# Patient Record
Sex: Male | Born: 1965 | Hispanic: No | Marital: Single | Smoking: Current every day smoker
Health system: Southern US, Community
[De-identification: ages and names within clinical notes are randomized; demographics above are authoritative.]

## PROBLEM LIST (undated history)

## (undated) HISTORY — PX: EYE SURGERY: SHX253

---

## 2001-01-12 ENCOUNTER — Emergency Department (HOSPITAL_COMMUNITY): Admission: EM | Admit: 2001-01-12 | Discharge: 2001-01-12 | Payer: Self-pay

## 2001-03-05 ENCOUNTER — Emergency Department (HOSPITAL_COMMUNITY): Admission: EM | Admit: 2001-03-05 | Discharge: 2001-03-05 | Payer: Self-pay | Admitting: Emergency Medicine

## 2013-09-23 ENCOUNTER — Ambulatory Visit (INDEPENDENT_AMBULATORY_CARE_PROVIDER_SITE_OTHER): Payer: BC Managed Care – PPO | Admitting: Physician Assistant

## 2013-09-23 ENCOUNTER — Ambulatory Visit: Payer: BC Managed Care – PPO

## 2013-09-23 VITALS — BP 106/82 | HR 79 | Temp 98.0°F | Resp 18 | Ht 63.0 in | Wt 147.0 lb

## 2013-09-23 DIAGNOSIS — Z789 Other specified health status: Secondary | ICD-10-CM

## 2013-09-23 DIAGNOSIS — D649 Anemia, unspecified: Secondary | ICD-10-CM

## 2013-09-23 DIAGNOSIS — R05 Cough: Secondary | ICD-10-CM

## 2013-09-23 DIAGNOSIS — J029 Acute pharyngitis, unspecified: Secondary | ICD-10-CM

## 2013-09-23 DIAGNOSIS — Z609 Problem related to social environment, unspecified: Secondary | ICD-10-CM

## 2013-09-23 DIAGNOSIS — R059 Cough, unspecified: Secondary | ICD-10-CM

## 2013-09-23 DIAGNOSIS — J189 Pneumonia, unspecified organism: Secondary | ICD-10-CM

## 2013-09-23 LAB — POCT CBC
Granulocyte percent: 54.5 %G (ref 37–80)
HEMATOCRIT: 37.8 % — AB (ref 43.5–53.7)
Hemoglobin: 11.9 g/dL — AB (ref 14.1–18.1)
LYMPH, POC: 1.9 (ref 0.6–3.4)
MCH: 29.9 pg (ref 27–31.2)
MCHC: 31.5 g/dL — AB (ref 31.8–35.4)
MCV: 95.1 fL (ref 80–97)
MID (cbc): 0.6 (ref 0–0.9)
MPV: 8.4 fL (ref 0–99.8)
POC Granulocyte: 3 (ref 2–6.9)
POC LYMPH %: 34.9 % (ref 10–50)
POC MID %: 10.6 %M (ref 0–12)
Platelet Count, POC: 201 10*3/uL (ref 142–424)
RBC: 3.98 M/uL — AB (ref 4.69–6.13)
RDW, POC: 13.5 %
WBC: 5.5 10*3/uL (ref 4.6–10.2)

## 2013-09-23 LAB — POCT RAPID STREP A (OFFICE): RAPID STREP A SCREEN: NEGATIVE

## 2013-09-23 MED ORDER — AZITHROMYCIN 500 MG PO TABS: 500.0000 mg | ORAL_TABLET | Freq: Every day | ORAL | Status: DC

## 2013-09-23 MED ORDER — HYDROCODONE-HOMATROPINE 5-1.5 MG/5ML PO SYRP
ORAL_SOLUTION | ORAL | Status: AC
Start: 2013-09-23 — End: ?

## 2013-09-23 MED ORDER — IPRATROPIUM BROMIDE 0.06 % NA SOLN: 2.0000 | Freq: Three times a day (TID) | NASAL | Status: DC

## 2013-09-23 NOTE — Progress Notes (Signed)
Subjective:    Patient ID: William Nichols, male    DOB: 05/26/1966, 48 y.o.   MRN: [id number]  HPI Primary Physician: No primary provider on file.  Chief Complaint: URI x 1 month  HPI: 48 y.o. [place] male with history below presents with 1 month history of on and off nasal congestion, rhinorrhea, post nasal drip, sore throat, cough, and chills. Afebrile, but with some chills. Cough is productive of white sputum and worse at nighttime keeping him awake. There is some SOB associated with coughing episodes, but no SOB at baseline. No wheezing. No headaches. Normal appetite. No myalgias. No known sick contacts. No flu vaccine this season. Has tried Advil and OTC cough syrup. Last took an antipyretic this afternoon. He is originally from [place] and moved here from there around 14 years ago. He is current everyday tobacco smoker.     History reviewed. No pertinent past medical history.   Home Meds: Prior to Admission medications   Not on File    Allergies: No Known Allergies  History   Social History  . Marital Status: Single    Spouse Name: N/A    Number of Children: N/A  . Years of Education: N/A   Occupational History  . Not on file.   Social History Main Topics  . Smoking status: Current Every Day Smoker  . Smokeless tobacco: Not on file  . Alcohol Use: Not on file  . Drug Use: Not on file  . Sexual Activity: Not on file   Other Topics Concern  . Not on file   Social History Narrative  . No narrative on file     Review of Systems  Constitutional: Positive for chills. Negative for fever, appetite change, fatigue and unexpected weight change.       No night sweats.   HENT: Positive for congestion, postnasal drip, rhinorrhea, sinus pressure and sore throat. Negative for ear pain and hearing loss.        Nasal congestion.   Eyes: Negative for pain, discharge and itching.  Respiratory: Positive for cough and shortness of breath. Negative for wheezing.        Cough is  productive of white sputum. Cough is worse at nighttime.  SOB secondary to coughing episodes only.     Cardiovascular: Negative for chest pain and palpitations.  Gastrointestinal: Negative for nausea, vomiting and diarrhea.  Musculoskeletal: Negative for myalgias.  Skin:       No pruritis.   Neurological: Negative for headaches.       Objective:   Physical Exam  Physical Exam: Blood pressure 106/82, pulse 79, temperature 98 F (36.7 C), resp. rate 18, height 5\' 3"  (1.6 m), weight 147 lb (66.679 kg), SpO2 98.00%., Body mass index is 26.05 kg/(m^2). General: Well developed, well nourished, in no acute distress. Head: Normocephalic, atraumatic, eyes without discharge, sclera non-icteric, nares are congested. Bilateral auditory canals clear, TM's are without perforation, pearly grey and translucent with reflective cone of light bilaterally. Oral cavity moist, posterior pharynx with post nasal drip and mild erythema. No exudate or peritonsillar abscess. Uvula midline.  Neck: Supple. No thyromegaly. Full ROM. No lymphadenopathy. No nuchal rigidity.  Lungs: Coarse breath sounds bilaterally without wheezes, rales, or rhonchi. Breathing is unlabored. Heart: RRR with S1 S2. No murmurs, rubs, or gallops appreciated. Msk:  Strength and tone normal for age. Extremities/Skin: Warm and dry. No clubbing or cyanosis. No edema. No rashes or suspicious lesions. Neuro: Alert and oriented X 3. Moves all extremities spontaneously.  Gait is normal. CNII-XII grossly in tact. Psych:  Responds to questions appropriately with a normal affect.   Labs: Results for orders placed in visit on 09/23/13  POCT CBC      Result Value Ref Range   WBC 5.5  4.6 - 10.2 K/uL   Lymph, poc 1.9  0.6 - 3.4   POC LYMPH PERCENT 34.9  10 - 50 %L   MID (cbc) 0.6  0 - 0.9   POC MID % 10.6  0 - 12 %M   POC Granulocyte 3.0  2 - 6.9   Granulocyte percent 54.5  37 - 80 %G   RBC 3.98 (*) 4.69 - 6.13 M/uL   Hemoglobin 11.9 (*) 14.1 -  18.1 g/dL   HCT, POC 16.1 (*) 09.6 - 53.7 %   MCV 95.1  80 - 97 fL   MCH, POC 29.9  27 - 31.2 pg   MCHC 31.5 (*) 31.8 - 35.4 g/dL   RDW, POC 04.5     Platelet Count, POC 201  142 - 424 K/uL   MPV 8.4  0 - 99.8 fL  POCT RAPID STREP A (OFFICE)      Result Value Ref Range   Rapid Strep A Screen Negative  Negative    Throat culture pending   CXR:  [facility] reading (PRIMARY) by  Dr. Perrin Maltese. Right perihilar infiltrate.       Assessment & Plan:  48 year old male with PNA, anemia, cough, and language barrier.   1) Right perihilar pneumonia  -Azithromycin 500 mg 1 po daily #5 no RF -Hycodan #4oz 1 tsp po q 4-6 hours prn cough no RF SED -Atrovent NS 0.06% 2 sprays each nare bid prn #1 no RF -Rest/fluids -Recheck 48 hours  2) Anemia -Advised patient to follow up for CPE in mid April -Discussed this could be ominous in nature as he has only been to the "doctor 2 times in his life"  3) Language barrier -Son is here to interpret in the room -Patient understands some english     Eula Listen, MHS, PA-C [facility] [address] [place], [place] [address] [phone] [facility] 09/23/2013 8:19 PM

## 2013-09-26 LAB — CULTURE, GROUP A STREP: Organism ID, Bacteria: NORMAL

## 2014-10-28 ENCOUNTER — Ambulatory Visit (INDEPENDENT_AMBULATORY_CARE_PROVIDER_SITE_OTHER): Payer: 59 | Admitting: Emergency Medicine

## 2014-10-28 VITALS — BP 110/68 | HR 80 | Temp 97.9°F | Resp 18 | Ht 64.0 in | Wt 144.0 lb

## 2014-10-28 DIAGNOSIS — L500 Allergic urticaria: Secondary | ICD-10-CM

## 2014-10-28 MED ORDER — FEXOFENADINE HCL 180 MG PO TABS: 180.0000 mg | ORAL_TABLET | Freq: Every day | ORAL | Status: DC

## 2014-10-28 MED ORDER — RANITIDINE HCL 150 MG PO TABS: 150.0000 mg | ORAL_TABLET | Freq: Two times a day (BID) | ORAL | Status: DC

## 2014-10-28 MED ORDER — HYDROXYZINE HCL 50 MG PO TABS: 50.0000 mg | ORAL_TABLET | Freq: Every day | ORAL | Status: DC

## 2014-10-28 NOTE — Progress Notes (Signed)
[facility] [address], [place] [id number] [phone]  Date:  10/28/2014   Name:  William Nichols   DOB:  06-10-1966   MRN:  [id number]  PCP:  No primary care provider on file.    Chief Complaint: Rash   History of Present Illness:  William Nichols is a 49 y.o. very pleasant male patient who presents with the following:  Urticaria over past three weeks No new personal care products or medication No cough or coryza.   No nausea or vomiting. No antecedent illness. No work related exposure that is new. Says itching is worse at night.   No insect bites.   Has a red rash on arms and lower back No improvement with over the counter medications or other home remedies.  Denies other complaint or health concern today.   There are no active problems to display for this patient.   History reviewed. No pertinent past medical history.  Past Surgical History  Procedure Laterality Date  . Eye surgery      History  Substance Use Topics  . Smoking status: Current Every Day Smoker  . Smokeless tobacco: Not on file  . Alcohol Use: Not on file    History reviewed. No pertinent family history.  No Known Allergies  Medication list has been reviewed and updated.  Current Outpatient Prescriptions on File Prior to Visit  Medication Sig Dispense Refill  . azithromycin (ZITHROMAX) 500 MG tablet Take 1 tablet (500 mg total) by mouth daily. (Patient not taking: Reported on 10/28/2014) 5 tablet 0  . HYDROcodone-homatropine (HYCODAN) 5-1.5 MG/5ML syrup 1 TSP PO Q 4-6 HOURS PRN COUGH (Patient not taking: Reported on 10/28/2014) 120 mL 0  . ipratropium (ATROVENT) 0.06 % nasal spray Place 2 sprays into the nose 3 (three) times daily. (Patient not taking: Reported on 10/28/2014) 15 mL 0   No current facility-administered medications on file prior to visit.    Review of Systems:  As per HPI, otherwise negative.    Physical Examination: Filed Vitals:   10/28/14 1512   BP: 110/68  Pulse: 80  Temp: 97.9 F (36.6 C)  Resp: 18   Filed Vitals:   10/28/14 1512  Height: 5\' 4"  (1.626 m)  Weight: 144 lb (65.318 kg)   Body mass index is 24.71 kg/(m^2). Ideal Body Weight: Weight in (lb) to have BMI = 25: 145.3  GEN: WDWN, NAD, Non-toxic, A & O x 3 HEENT: Atraumatic, Normocephalic. Neck supple. No masses, No LAD. Ears and Nose: No external deformity. CV: RRR, No M/G/R. No JVD. No thrill. No extra heart sounds. PULM: CTA B, no wheezes, crackles, rhonchi. No retractions. No resp. distress. No accessory muscle use. ABD: S, NT, ND, +BS. No rebound. No HSM. EXTR: No c/c/e NEURO Normal gait.  PSYCH: Normally interactive. Conversant. Not depressed or anxious appearing.  Calm demeanor.  SKIN:  Erythematous excoriated area both forearms.  Assessment and Plan: Hives Allegra Vistaril Ranitidine  Signed,  Phillips OdorJeffery Shaquella Stamant, MD

## 2014-10-28 NOTE — Patient Instructions (Signed)
Hives Hives are itchy, red, swollen areas of the skin. They can vary in size and location on your body. Hives can come and go for hours or several days (acute hives) or for several weeks (chronic hives). Hives do not spread from person to person (noncontagious). They may get worse with scratching, exercise, and emotional stress. CAUSES   Allergic reaction to food, additives, or drugs.  Infections, including the common cold.  Illness, such as vasculitis, lupus, or thyroid disease.  Exposure to sunlight, heat, or cold.  Exercise.  Stress.  Contact with chemicals. SYMPTOMS   Red or white swollen patches on the skin. The patches may change size, shape, and location quickly and repeatedly.  Itching.  Swelling of the hands, feet, and face. This may occur if hives develop deeper in the skin. DIAGNOSIS  Your caregiver can usually tell what is wrong by performing a physical exam. Skin or blood tests may also be done to determine the cause of your hives. In some cases, the cause cannot be determined. TREATMENT  Mild cases usually get better with medicines such as antihistamines. Severe cases may require an emergency epinephrine injection. If the cause of your hives is known, treatment includes avoiding that trigger.  HOME CARE INSTRUCTIONS   Avoid causes that trigger your hives.  Take antihistamines as directed by your caregiver to reduce the severity of your hives. Non-sedating or low-sedating antihistamines are usually recommended. Do not drive while taking an antihistamine.  Take any other medicines prescribed for itching as directed by your caregiver.  Wear loose-fitting clothing.  Keep all follow-up appointments as directed by your caregiver. SEEK MEDICAL CARE IF:   You have persistent or severe itching that is not relieved with medicine.  You have painful or swollen joints. SEEK IMMEDIATE MEDICAL CARE IF:   You have a fever.  Your tongue or lips are swollen.  You have  trouble breathing or swallowing.  You feel tightness in the throat or chest.  You have abdominal pain. These problems may be the first sign of a life-threatening allergic reaction. Call your local emergency services (911 in [place] MAKE SURE YOU:   Understand these instructions.  Will watch your condition.  Will get help right away if you are not doing well or get worse. Document Released: 06/20/2005 Document Revised: 06/25/2013 Document Reviewed: 09/13/2011 ExitCare Patient Information 2015 [employer]. This information is not intended to replace advice given to you by your health care provider. Make sure you discuss any questions you have with your health care provider.  

## 2014-12-25 ENCOUNTER — Ambulatory Visit (INDEPENDENT_AMBULATORY_CARE_PROVIDER_SITE_OTHER): Payer: 59 | Admitting: Physician Assistant

## 2014-12-25 VITALS — BP 120/82 | HR 71 | Temp 97.6°F | Resp 18 | Ht 63.0 in | Wt 143.0 lb

## 2014-12-25 DIAGNOSIS — L500 Allergic urticaria: Secondary | ICD-10-CM

## 2014-12-25 MED ORDER — FEXOFENADINE HCL 180 MG PO TABS: 180.0000 mg | ORAL_TABLET | Freq: Every day | ORAL | Status: AC

## 2014-12-25 MED ORDER — HYDROXYZINE HCL 50 MG PO TABS: 50.0000 mg | ORAL_TABLET | Freq: Every day | ORAL | Status: AC

## 2014-12-25 MED ORDER — RANITIDINE HCL 150 MG PO TABS: 150.0000 mg | ORAL_TABLET | Freq: Two times a day (BID) | ORAL | Status: AC

## 2014-12-25 NOTE — Progress Notes (Signed)
.   [facility] [address] [place] [address] [phone]  Date:  12/25/2014   Name:  William Nichols   DOB:  1965/08/07   MRN:  [id number]  PCP:  No primary care provider on file.    Chief Complaint: Rash   History of Present Illness:  This is a 49 y.o. male who is presenting with rash "all over" x 2 weeks. He was seen for urticarial rash on 10/28/14 and prescribed allegra, atarax and zantac. Rash resolved on these medications. He ran out of his medications 2 weeks ago and rash came back.  Changed detergent to tide gentle. 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 No change in mattresses or pillows. Moved to new house end of march.  Has had a dog x 1 year, no problems prior to April 2016. No seasonal allergies.  No food allergies. Denies fever, chills, GI sx, SOB, wheezing, CP.  Review of Systems:  Review of Systems See HPI There are no active problems to display for this patient.  Home meds: none   No Known Allergies  Past Surgical History  Procedure Laterality Date  . Eye surgery      History  Substance Use Topics  . Smoking status: Current Every Day Smoker  . Smokeless tobacco: Not on file  . Alcohol Use: Not on file    History reviewed. No pertinent family history.  Medication list has been reviewed and updated.  Physical Examination:  Physical Exam  Constitutional: He is oriented to person, place, and time. He appears well-developed and well-nourished. No distress.  HENT:  Head: Normocephalic and atraumatic.  Right Ear: Hearing normal.  Left Ear: Hearing normal.  Nose: Nose normal.  Eyes: Conjunctivae and lids are normal. Right eye exhibits no discharge. Left eye exhibits no discharge. No scleral icterus.  Cardiovascular: Normal rate, regular rhythm, normal heart sounds and normal pulses.   No murmur heard. Pulmonary/Chest: Effort normal and breath sounds normal. No respiratory distress. He has no wheezes. He has no rhonchi.  He has no rales.  Musculoskeletal: Normal range of motion.  Neurological: He is alert and oriented to person, place, and time.  Skin: Skin is warm, dry and intact. Rash noted. Rash is urticarial (bilateral arms, chest, abdomen, back. sparing the face).  Dermatographism present  Psychiatric: He has a normal mood and affect. His speech is normal and behavior is normal. Thought content normal.   BP 120/82 mmHg  Pulse 71  Temp(Src) 97.6 F (36.4 C) (Oral)  Resp 18  Ht 5\' 3"  (1.6 m)  Wt 143 lb (64.864 kg)  BMI 25.34 kg/m2  SpO2 97%  Assessment and Plan:  1. Allergic urticaria Refilled meds prescribed 4/26 since worked. Zantac BID, atarax QHS, allergra QD. Suspect he is reacting to something in his new house. Referred to allergist for patch testing. - fexofenadine (ALLEGRA ALLERGY) 180 MG tablet; Take 1 tablet (180 mg total) by mouth daily.  Dispense: 30 tablet; Refill: 2 - hydrOXYzine (ATARAX/VISTARIL) 50 MG tablet; Take 1 tablet (50 mg total) by mouth at bedtime. May increase to 100 mg as needed only at night  Dispense: 30 tablet; Refill: 1 - ranitidine (ZANTAC) 150 MG tablet; Take 1 tablet (150 mg total) by mouth 2 (two) times daily.  Dispense: 60 tablet; Refill: 0 - Ambulatory referral to Allergy   Roswell Miners. Dyke Brackett, MHS [facility]  12/25/2014

## 2014-12-25 NOTE — Patient Instructions (Signed)
Take zantac twice a day. Take allergra once a day. Take atarax at night. You will get a phone call to make appt with allergist.

## 2019-03-22 ENCOUNTER — Emergency Department (HOSPITAL_COMMUNITY): Payer: BLUE CROSS/BLUE SHIELD

## 2019-03-22 ENCOUNTER — Emergency Department (HOSPITAL_COMMUNITY)
Admission: EM | Admit: 2019-03-22 | Discharge: 2019-03-22 | Disposition: A | Discharge status: Home / Self Care | Payer: BLUE CROSS/BLUE SHIELD | Attending: Emergency Medicine | Admitting: Emergency Medicine

## 2019-03-22 ENCOUNTER — Other Ambulatory Visit: Payer: Self-pay

## 2019-03-22 DIAGNOSIS — S8992XA Unspecified injury of left lower leg, initial encounter: Secondary | ICD-10-CM | POA: Diagnosis present

## 2019-03-22 DIAGNOSIS — Y929 Unspecified place or not applicable: Secondary | ICD-10-CM | POA: Diagnosis not present

## 2019-03-22 DIAGNOSIS — X58XXXA Exposure to other specified factors, initial encounter: Secondary | ICD-10-CM | POA: Insufficient documentation

## 2019-03-22 DIAGNOSIS — W19XXXA Unspecified fall, initial encounter: Secondary | ICD-10-CM

## 2019-03-22 DIAGNOSIS — F17200 Nicotine dependence, unspecified, uncomplicated: Secondary | ICD-10-CM | POA: Insufficient documentation

## 2019-03-22 DIAGNOSIS — Z79899 Other long term (current) drug therapy: Secondary | ICD-10-CM | POA: Insufficient documentation

## 2019-03-22 DIAGNOSIS — Y999 Unspecified external cause status: Secondary | ICD-10-CM | POA: Insufficient documentation

## 2019-03-22 DIAGNOSIS — S8990XA Unspecified injury of unspecified lower leg, initial encounter: Secondary | ICD-10-CM

## 2019-03-22 DIAGNOSIS — Y939 Activity, unspecified: Secondary | ICD-10-CM | POA: Diagnosis not present

## 2019-03-22 MED ORDER — HYDROCODONE-ACETAMINOPHEN 5-325 MG PO TABS: 1.0000 | ORAL_TABLET | Freq: Four times a day (QID) | ORAL | 0 refills | Status: AC | PRN

## 2019-03-22 MED ORDER — IBUPROFEN 800 MG PO TABS
800.0000 mg | ORAL_TABLET | Freq: Once | ORAL | Status: AC
  Administered 2019-03-22: 800 mg via ORAL
  Filled 2019-03-22: qty 1

## 2019-03-22 MED ORDER — IBUPROFEN 800 MG PO TABS: 800.0000 mg | ORAL_TABLET | Freq: Three times a day (TID) | ORAL | 0 refills | Status: AC | PRN

## 2019-03-22 MED ORDER — IBUPROFEN 200 MG PO TABS
400.0000 mg | ORAL_TABLET | Freq: Once | ORAL | Status: AC | PRN
  Administered 2019-03-22: 400 mg via ORAL
  Filled 2019-03-22: qty 2

## 2019-03-22 NOTE — ED Provider Notes (Signed)
[facility] DEPT Provider Note   CSN: [id number] Arrival date & time: 03/22/19  4098     History   Chief Complaint No chief complaint on file.   HPI William Nichols is a 53 y.o. male.     Pt reports walking on stairs and had a pain in the back of his leg.   The history is provided by the patient. No language interpreter was used.  Leg Pain Location:  Knee and leg Time since incident:  1 day Injury: yes   Leg location:  L lower leg Knee location:  L knee Pain details:    Quality:  Aching   Radiates to:  Does not radiate   Severity:  Moderate   Onset quality:  Gradual   Timing:  Constant   Progression:  Worsening Chronicity:  New Dislocation: no   Tetanus status:  Up to date Relieved by:  Nothing Worsened by:  Nothing Ineffective treatments:  None tried Associated symptoms: no fever   Risk factors: no concern for non-accidental trauma     No past medical history on file.  There are no active problems to display for this patient.   Past Surgical History:  Procedure Laterality Date  . EYE SURGERY          Home Medications    Prior to Admission medications   Medication Sig Start Date End Date Taking? Authorizing Provider  fexofenadine (ALLEGRA ALLERGY) 180 MG tablet Take 1 tablet (180 mg total) by mouth daily. 12/25/14   Ezekiel Slocumb, PA-C  HYDROcodone-homatropine (HYCODAN) 5-1.5 MG/5ML syrup 1 TSP PO Q 4-6 HOURS PRN COUGH Patient not taking: Reported on 10/28/2014 09/23/13   Rise Mu, PA-C  hydrOXYzine (ATARAX/VISTARIL) 50 MG tablet Take 1 tablet (50 mg total) by mouth at bedtime. May increase to 100 mg as needed only at night 12/25/14   Ezekiel Slocumb, PA-C  ranitidine (ZANTAC) 150 MG tablet Take 1 tablet (150 mg total) by mouth 2 (two) times daily. 12/25/14   Ezekiel Slocumb, PA-C    Family History No family history on file.  Social History Social History   Tobacco Use  . Smoking status: Current Every Day Smoker   Substance Use Topics  . Alcohol use: Not on file  . Drug use: Not on file     Allergies   Patient has no known allergies.   Review of Systems Review of Systems  Constitutional: Negative for fever.  All other systems reviewed and are negative.    Physical Exam Updated Vital Signs BP 125/77 (BP Location: Left Arm)   Pulse 79   Temp 98.3 F (36.8 C) (Oral)   Resp 16   Ht 5\' 4"  (1.626 m)   Wt 63.5 kg   SpO2 99%   BMI 24.03 kg/m   Physical Exam Musculoskeletal:        General: Swelling and tenderness present.     Comments: Tender calf muscle swollen,  nv and ns intact   Skin:    General: Skin is warm.  Neurological:     General: No focal deficit present.  Psychiatric:        Mood and Affect: Mood normal.      ED Treatments / Results  Labs (all labs ordered are listed, but only abnormal results are displayed) Labs Reviewed - No data to display  EKG None  Radiology Dg Knee Complete 4 Views Left  Result Date: 03/22/2019 CLINICAL DATA:  Fall yesterday with left knee pain  and swelling. EXAM: LEFT KNEE - COMPLETE 4+ VIEW COMPARISON:  None. FINDINGS: No evidence of fracture, dislocation, or joint effusion. No evidence of arthropathy or other focal bone abnormality. Soft tissues are unremarkable. IMPRESSION: Negative. Electronically Signed   By: Elberta Fortisaniel  Boyle M.D.   On: 03/22/2019 10:28    Procedures Procedures (including critical care time)  Medications Ordered in ED Medications  ibuprofen (ADVIL) tablet 400 mg (400 mg Oral Given 03/22/19 0934)     Initial Impression / Assessment and Plan / ED Course  I have reviewed the triage vital signs and the nursing notes.  Pertinent labs & imaging results that were available during my care of the patient were reviewed by me and considered in my medical decision making (see chart for details).        MDM   Xray no acute.  I suspect muscle tear. probable soleus muscle I doubt DVt   Schedule to see Dr. Magnus IvanBlackman for  evatluion.  Final Clinical Impressions(s) / ED Diagnoses   Final diagnoses:  Injury of calf    ED Discharge Orders         Ordered    HYDROcodone-acetaminophen (NORCO/VICODIN) 5-325 MG tablet  Every 6 hours PRN     03/22/19 1156        An After Visit Summary was printed and given to the patient.   Osie CheeksSofia, Diasha Castleman K, PA-C 03/22/19 1201    Terrilee FilesButler, Michael C, MD 03/22/19 1924

## 2019-03-22 NOTE — Discharge Instructions (Addendum)
Schedule to see the Orthopaedist for evaluation  

## 2019-03-22 NOTE — ED Notes (Signed)
Called ortho tech 

## 2020-04-04 IMAGING — CR DG KNEE COMPLETE 4+V*L*
4 series · 4 of 4 positions shown · non-contrast
Comparison: None.

CLINICAL DATA: Fall yesterday with left knee pain and swelling.

EXAM:
LEFT KNEE - COMPLETE 4+ VIEW

[t knee ap left]
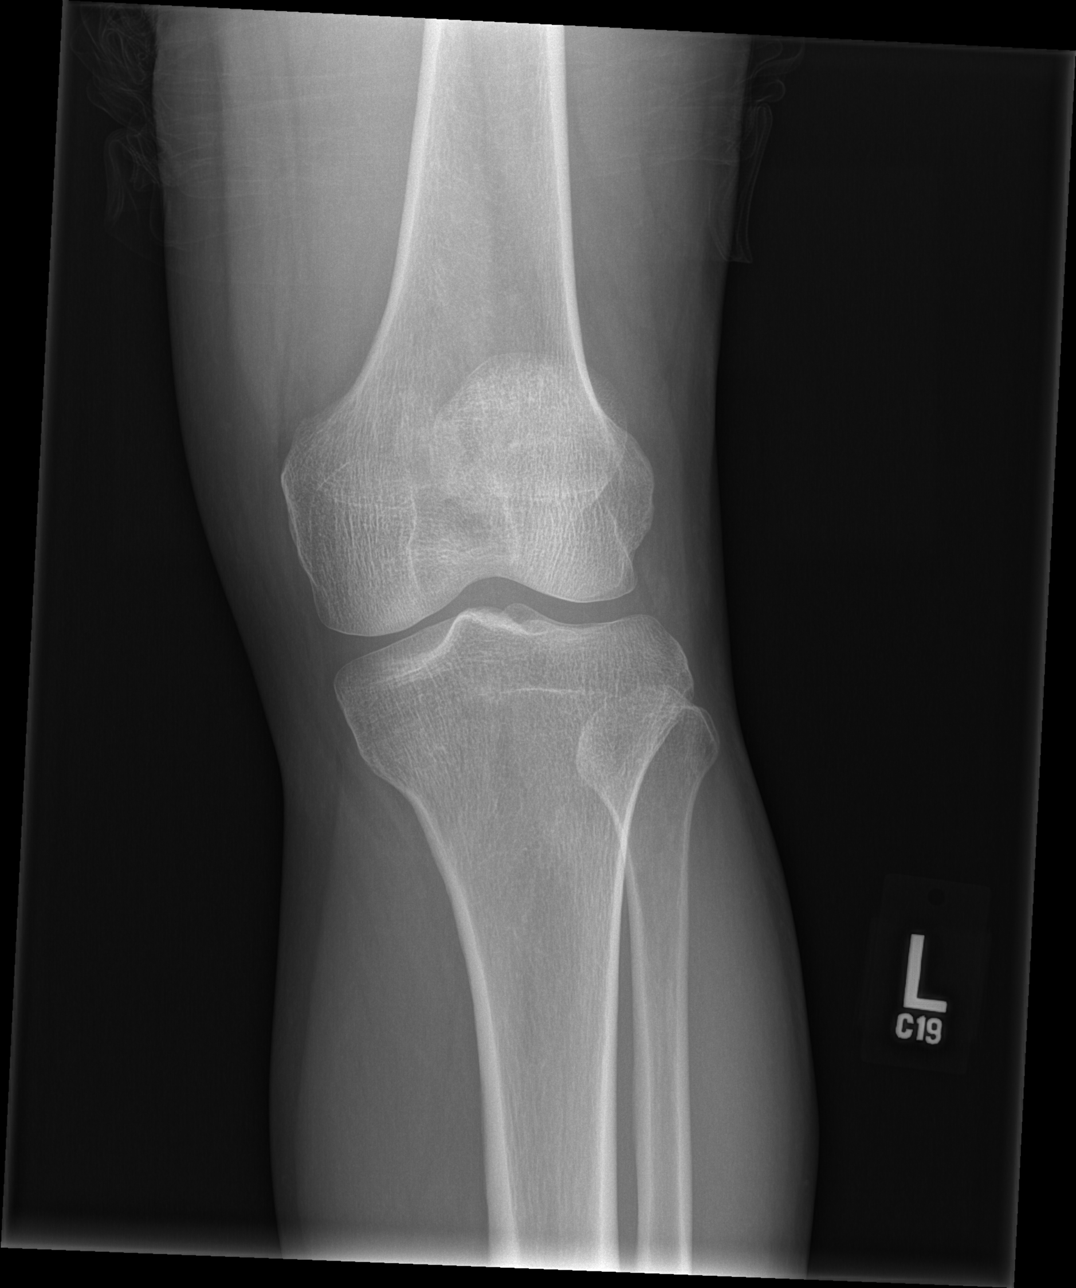

[t knee obl left (1 of 2)]
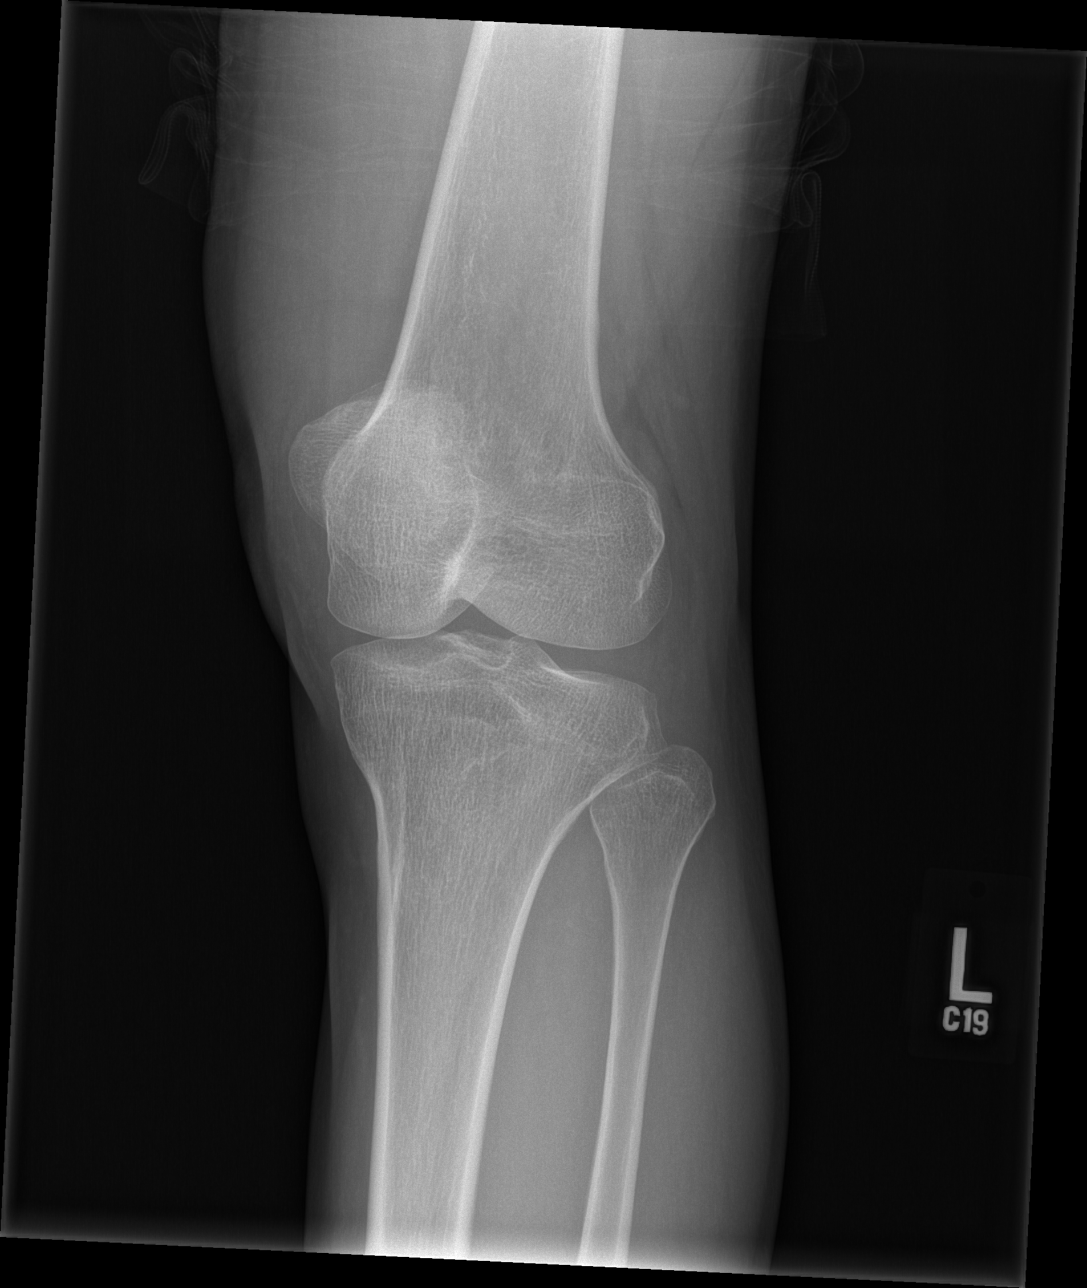

[t knee obl left (2 of 2)]
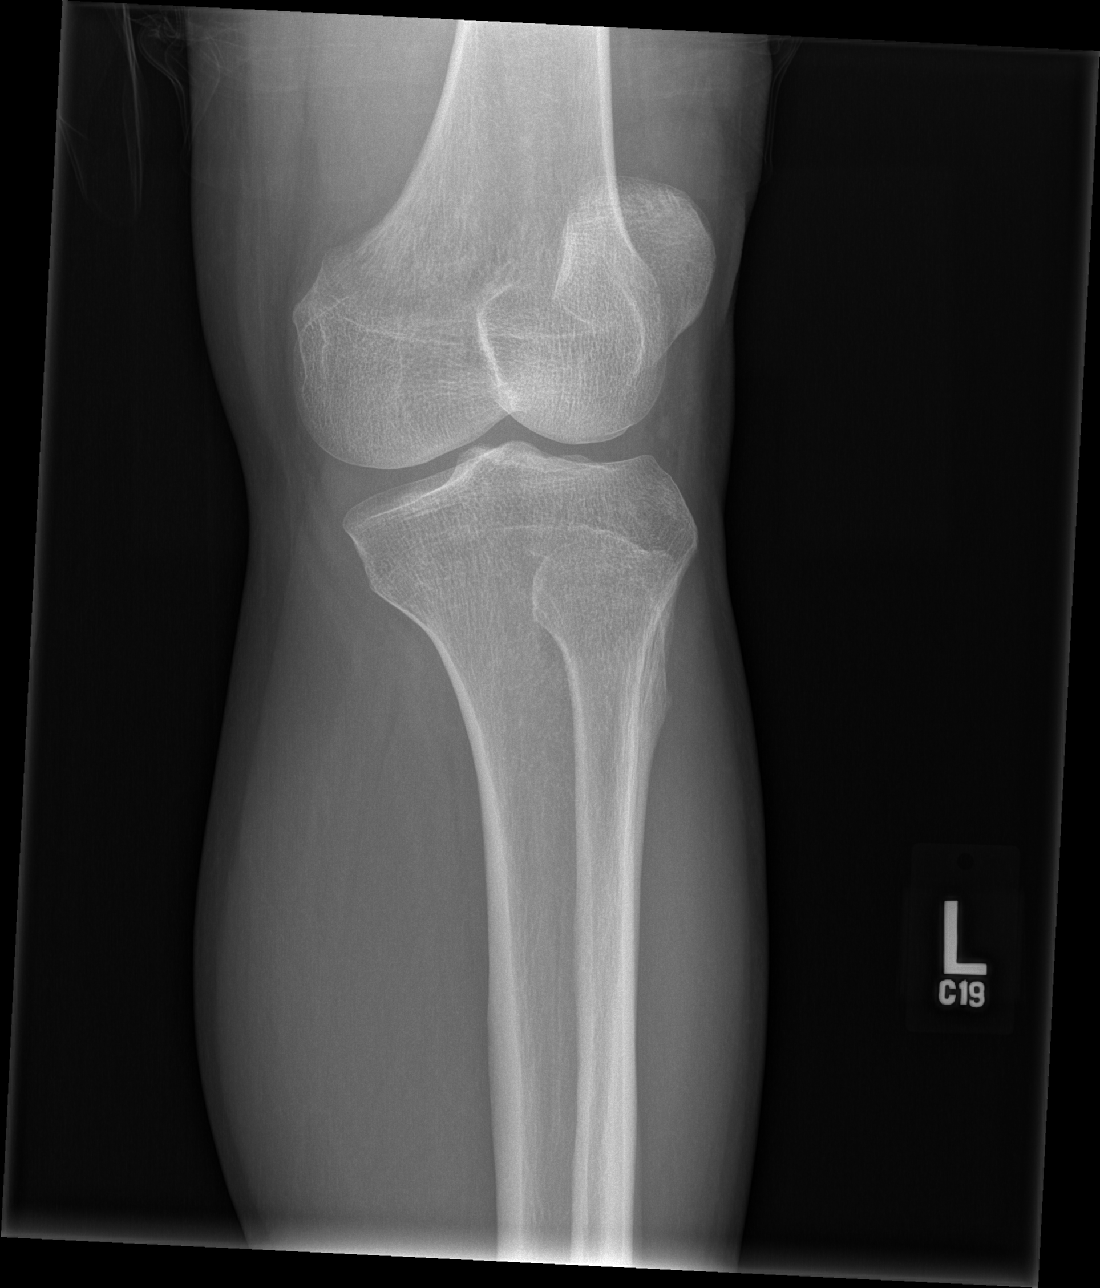

[t knee lat left]
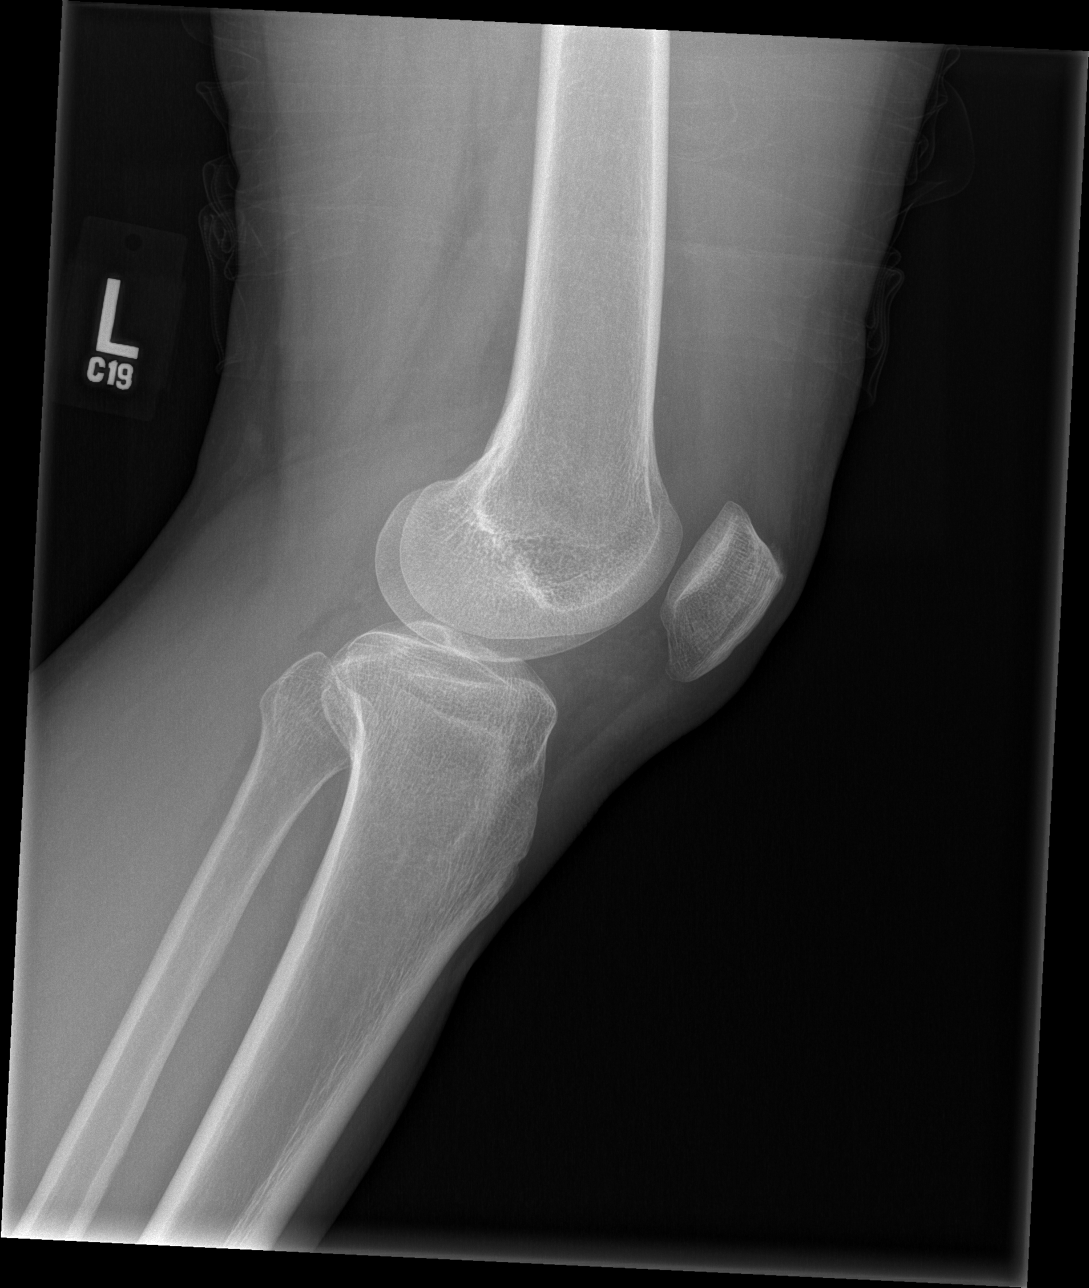

[4 of 4 positions shown; findings below may reference images not displayed]

FINDINGS: No evidence of fracture, dislocation, or joint effusion. No evidence
of arthropathy or other focal bone abnormality. Soft tissues are
unremarkable.
IMPRESSION: Negative.
